# Patient Record
Sex: Female | Born: 1950 | Race: White | Hispanic: No | Marital: Married | Smoking: Current every day smoker
Health system: Southern US, Community
[De-identification: ages and names within clinical notes are randomized; demographics above are authoritative.]

## PROBLEM LIST (undated history)

## (undated) DIAGNOSIS — I10 Essential (primary) hypertension: Secondary | ICD-10-CM

## (undated) DIAGNOSIS — R0600 Dyspnea, unspecified: Secondary | ICD-10-CM

## (undated) DIAGNOSIS — E785 Hyperlipidemia, unspecified: Secondary | ICD-10-CM

## (undated) DIAGNOSIS — I471 Supraventricular tachycardia, unspecified: Secondary | ICD-10-CM

## (undated) DIAGNOSIS — K219 Gastro-esophageal reflux disease without esophagitis: Secondary | ICD-10-CM

## (undated) DIAGNOSIS — R0609 Other forms of dyspnea: Secondary | ICD-10-CM

## (undated) DIAGNOSIS — Z720 Tobacco use: Secondary | ICD-10-CM

## (undated) HISTORY — DX: Gastro-esophageal reflux disease without esophagitis: K21.9

## (undated) HISTORY — DX: Hyperlipidemia, unspecified: E78.5

## (undated) HISTORY — DX: Other forms of dyspnea: R06.09

## (undated) HISTORY — DX: Tobacco use: Z72.0

## (undated) HISTORY — DX: Supraventricular tachycardia: I47.1

## (undated) HISTORY — DX: Dyspnea, unspecified: R06.00

## (undated) HISTORY — DX: Essential (primary) hypertension: I10

## (undated) HISTORY — DX: Supraventricular tachycardia, unspecified: I47.10

---

## 2008-11-20 ENCOUNTER — Encounter: Payer: Self-pay | Admitting: Cardiovascular Disease

## 2010-04-11 ENCOUNTER — Encounter: Payer: Self-pay | Admitting: Cardiovascular Disease

## 2010-05-02 ENCOUNTER — Ambulatory Visit: Payer: Self-pay | Admitting: Cardiovascular Disease

## 2010-05-22 ENCOUNTER — Ambulatory Visit: Payer: Self-pay | Admitting: Cardiology

## 2010-05-22 ENCOUNTER — Ambulatory Visit: Payer: Self-pay

## 2010-05-22 ENCOUNTER — Ambulatory Visit (HOSPITAL_COMMUNITY): Admission: RE | Admit: 2010-05-22 | Discharge: 2010-05-22 | Payer: Self-pay | Admitting: Cardiovascular Disease

## 2010-05-26 ENCOUNTER — Ambulatory Visit: Payer: Self-pay | Admitting: Cardiovascular Disease

## 2010-11-27 ENCOUNTER — Encounter: Payer: Self-pay | Admitting: Cardiovascular Disease

## 2010-12-11 ENCOUNTER — Ambulatory Visit (INDEPENDENT_AMBULATORY_CARE_PROVIDER_SITE_OTHER): Payer: BC Managed Care – PPO | Admitting: Cardiovascular Disease

## 2010-12-11 ENCOUNTER — Encounter: Payer: Self-pay | Admitting: Cardiovascular Disease

## 2010-12-11 DIAGNOSIS — I498 Other specified cardiac arrhythmias: Secondary | ICD-10-CM

## 2010-12-11 DIAGNOSIS — R079 Chest pain, unspecified: Secondary | ICD-10-CM

## 2010-12-11 DIAGNOSIS — I471 Supraventricular tachycardia: Secondary | ICD-10-CM | POA: Insufficient documentation

## 2010-12-11 DIAGNOSIS — I10 Essential (primary) hypertension: Secondary | ICD-10-CM

## 2010-12-11 DIAGNOSIS — Z720 Tobacco use: Secondary | ICD-10-CM

## 2010-12-11 DIAGNOSIS — F17200 Nicotine dependence, unspecified, uncomplicated: Secondary | ICD-10-CM

## 2010-12-11 NOTE — Assessment & Plan Note (Signed)
This has improved significantly. Stress test was negative last year. Symptoms are atypical overall.

## 2010-12-11 NOTE — Progress Notes (Signed)
HPI  Ms. Karim is here for a 6 months follow up visit. She has been doing well overall. No episodes of tachycardia or any other arrhythmia. No palpitations, syncope or presyncope. Her chest pain has improved significantly. She still complains of dyspnea but she continues to smoke.   Allergies  Allergen Reactions  . Codeine   . Nsaids      Current Outpatient Prescriptions on File Prior to Visit  Medication Sig Dispense Refill  . diltiazem (CARDIZEM CD) 240 MG 24 hr capsule Take 240 mg by mouth daily.        . famotidine (PEPCID AC) 10 MG chewable tablet Chew 10 mg by mouth daily.        Marland Kitchen lisinopril (PRINIVIL,ZESTRIL) 10 MG tablet Take 10 mg by mouth daily.        Marland Kitchen DISCONTD: terbinafine (LAMISIL) 250 MG tablet Take 250 mg by mouth daily.          Past Medical History  Diagnosis Date  . Hyperlipidemia   . GERD (gastroesophageal reflux disease)   . Tobacco user   . Chest pain     recurrent  . Dyspnea on exertion   . 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 Hypertension      History reviewed. No pertinent past surgical history.   Family History  Problem Relation Age of Onset  . Cancer Father 30     History   Social History  . Marital Status: Married    Spouse Name: N/A    Number of Children: N/A  . Years of Education: N/A   Occupational History  . Not on file.   Social History Main Topics  . Smoking status: Current Everyday Smoker -- 0.5 packs/day for 45 years    Types: Cigarettes  . Smokeless tobacco: Not on file  . Alcohol Use: No  . Drug Use: No  . Sexually Active:    Other Topics Concern  . Not on file   Social History Narrative  . No narrative on file     ROS Constitutional: Negative for fever, chills, diaphoresis, activity change, appetite change and fatigue.  HENT: Negative for hearing loss, nosebleeds, congestion, sore throat, facial swelling, drooling, trouble swallowing, neck pain, voice  change, sinus pressure and tinnitus.  Eyes: Negative for photophobia, pain, discharge and visual disturbance.  Respiratory: Negative for apnea, cough, chest tightness and wheezing. Positive for shortness of breath Cardiovascular: Negative for chest pain, palpitations and leg swelling.  Gastrointestinal: Negative for nausea, vomiting, abdominal pain, diarrhea, constipation, blood in stool and abdominal distention.  Genitourinary: Negative for dysuria, urgency, frequency, hematuria and decreased urine volume.  Musculoskeletal: Negative for myalgias, back pain, joint swelling, arthralgias and gait problem.  Skin: Negative for color change, pallor, rash and wound.  Neurological: Negative for dizziness, tremors, seizures, syncope, speech difficulty, weakness, light-headedness, numbness and headaches.  Psychiatric/Behavioral: Negative for suicidal ideas, hallucinations, behavioral problems and agitation. The patient is not nervous/anxious.     PHYSICAL EXAM   BP 139/75  Pulse 88  Wt 180 lb 12.8 oz (82.01 kg)  SpO2 97%  Constitutional: He is oriented to person, place, and time. He appears well-developed and well-nourished. No distress.  HENT:  Head: Normocephalic and atraumatic.  Eyes: Pupils are equal, round, and reactive to light. Right eye exhibits no discharge. Left eye exhibits no discharge.  Neck: Normal range of motion. Neck supple. No JVD present. No thyromegaly present.  Cardiovascular: Normal rate, regular rhythm, normal  heart sounds and intact distal pulses. Exam reveals no gallop and no friction rub.  No murmur heard.  Pulmonary/Chest: Effort normal and breath sounds normal. No stridor. No respiratory distress. He has no wheezes. He has no rales. He exhibits no tenderness.  Abdominal: Soft. Bowel sounds are normal. He exhibits no distension. There is no tenderness. There is no rebound and no guarding.  Musculoskeletal: Normal range of motion. He exhibits no edema and no tenderness.   Neurological: He is alert and oriented to person, place, and time. Coordination normal.  Skin: Skin is warm and dry. No rash noted. He is not diaphoretic. No erythema. No pallor.  Psychiatric: He has a normal mood and affect. His behavior is normal. Judgment and thought content normal.        ASSESSMENT AND PLAN

## 2010-12-11 NOTE — Assessment & Plan Note (Signed)
No new episodes. Continue Diltiazem.

## 2010-12-11 NOTE — Assessment & Plan Note (Signed)
I advised her to quit smoking. She doesn't seem to be ready. She is trying to quit gradually on her own.

## 2010-12-11 NOTE — Assessment & Plan Note (Signed)
BP is reasonably controlled with current medications which will be continued.

## 2011-01-27 NOTE — Letter (Signed)
May 02, 2010    Blane Ohara, MD.   RE:  Laura Morris, Laura Morris  MRN:  [id number]  /  DOB:  1950/12/11   RE:  Laura Morris   Dear Dr. Sedalia Muta:   Thank you for referring Laura Morris for cardiac evaluation.  As you are  aware, this is a pleasant 60 year old female with the following problem  list:  1. Hypertension.  2. 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  3. Tobacco use.   CLINICAL HISTORY:  The patient is here today for evaluation of chest  pain.  This started in March 2010.  At that time, she had chest  tightness and went to [facility]  She ruled out for  myocardial infarction.  She underwent a nuclear stress test at that time  which was negative for ischemia.  She also had episodes of palpitations  and tachycardia that required one emergency room visit.  During that  occasion, her heart rate was above 200 according to the patient and was  given a medications with IV most likely adenosine.  Since then, she was  placed on Cardizem and has not had any further episodes.  Regarding her  chest pain, she describes it as tightness which is substernally with no  radiation.  It happens usually at rest and can wake her up from sleep.  It does not happen with activities.  The discomfort does not radiate to  the arm or the neck.  There are no other associations.  Overall, the  discomfort is mild.  There does not seem to be any aggravating or  relieving factors.  Sometimes it is associated to anxiety especially at  work.  She does have chronic dyspnea, but there has been no change in  this.  There is no orthopnea or PND.  There is no presyncope or syncope.   PAST MEDICAL HISTORY:  As mentioned above.   MEDICATIONS:  1. Lisinopril 10 mg daily.  2. Terbinafine 250 mg once daily.  3. Diltiazem XR 240 mg once daily.  4. Pepcid AC once daily.   ALLERGIES:  1. CODEINE.  2. ANTI-INFLAMMATORY DRUGS INCLUDING NONSTEROIDAL ANTI-INFLAMMATORY       DRUGS.   SOCIAL HISTORY:  Remarkable for smoking half pack per day since she was  43 years.  She denies any alcohol or recreational drug use.  There is no  regular exercise program.  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   FAMILY HISTORY:  Remarkable for coronary artery disease.  Her mother had  coronary artery disease in her 69s as well as her brother.   REVIEW OF SYSTEMS:  Remarkable for chest pain, dyspnea, heartburn and  constipation.  A full review of system was performed and is otherwise  negative.   PHYSICAL EXAMINATION:  GENERAL:  The patient is pleasant and in no acute  distress.  VITAL SIGNS:  Weight is 185 pounds, blood pressure is 165/75, pulse is  67, oxygen saturation is 97% on room air.  HEENT:  Normocephalic, atraumatic.  NECK:  Examination reveals no JVD or carotid bruits.  RESPIRATORY:  Normal respiratory effort with no use of accessory  muscles.  Auscultation reveals normal breath sounds with no crackles or  wheezing.  CARDIOVASCULAR:  Normal PMI.  Regular rate and rhythm with no gallops or  murmurs.  ABDOMEN:  Benign, nontender, nondistended.  EXTREMITIES:  With no clubbing, cyanosis or edema.  SKIN:  Examination is warm and dry with no rash.  PSYCHIATRIC:  She is alert, oriented x3 with normal mood and affect.  MUSCULOSKELETAL:  There is normal muscle strength in the upper and lower  extremities.   DIAGNOSTICS:  The electrocardiogram was done which showed normal sinus  rhythm with poor R-wave progression in the anterior leads and left axis  deviation.   LABORATORY DATA:  Her labs were reviewed and overall were unremarkable.   IMPRESSION:  1. Atypical chest pain with exertional dyspnea:  She did have a stress      test done about 1-1/2 years ago in March 2010 which was negative      for ischemia.  However since then, she had more recurrent episodes      and seems to be happening more frequently recently.  Her risk      factors include  hypertension, tobacco use and family history of      heart disease.  I would like to evaluate this by stress      echocardiogram.  This will give [place] an idea also about the structure      of her heart and make sure there are no structural abnormalities to      account for her symptoms.  If the stress test is negative, other      possibility is small vessel disease or endothelial dysfunction      which can happen more frequently in females.  In that situation, we      will attempt to treat with a small dose aspirin and long-acting      nitroglycerin.  The patient does have gastroesophageal reflux      disease, but her symptoms are reasonably controlled with Pepcid.      Chest tightness does not seem to be related.  2. Palpitations with history of supraventricular tachycardia:  The      patient has not had any recent episodes of tachycardia.  Her      symptoms have been controlled with diltiazem which will be      continued.  If she gets any recurrent episodes in the future, then      an antiarrhythmic medication or an ablation procedure can be      considered.  3. Hypertension:  Her blood pressure is elevated, but she did not take      her medications today.  We will continue to monitor.  4. Tobacco use:  The patient was counseled regarding the importance of      smoking cessation.   Thank you for allowing me to participate in the care of your patient.    Sincerely,      Lorine Bears, MD  Electronically Signed    MA/MedQ  DD: 05/02/2010  DT: 05/02/2010  Job #: [id number]

## 2011-01-27 NOTE — Assessment & Plan Note (Signed)
[facility]                        [facility] CARDIOLOGY OFFICE NOTE   SONNIE, BIAS                       MRN:          [id number]  DATE:05/26/2010                            DOB:          Oct 01, 1950    PROBLEMS LIST:  1. Hypertension.  2. Supraventricular tachycardia due to atrioventricular nodal reentry      tachycardia.  3. Tobacco use.  4. Recurrent chest pain.   INTERVAL HISTORY:  I saw Ms. Chalk last month for evaluation of chest  pain.  She continues to have mild chest tightness which most of the time  happens at rest and sometimes when she is stressed or anxious.  She gets  out of breath with activities.  She continues to smoke between half a  pack to pack per day for many years.  She has not had any recurrent  episodes of tachycardia.   MEDICATIONS:  1. Lisinopril 10 mg daily.  2. Diltiazem XR 240 mg once daily.  3. Pepcid AC once daily.   PHYSICAL EXAMINATION:  VITAL SIGNS:  Weight is 183.2 pounds, blood  pressure is 157/81, pulse is 68, oxygen saturation is 97% on room air.  NECK:  Reveals no JVD or carotid bruits.  LUNGS:  Clear to auscultation.  HEART:  Regular rate and rhythm with no gallops or murmurs.  ABDOMEN:  Benign, nontender, nondistended.  EXTREMITIES:  No clubbing, cyanosis, or edema.   IMPRESSION:  1. Atypical chest pain with exertional dyspnea:  We did stress      echocardiogram.  She was able to exercise 2 stages on Bruce      protocol.  She achieved maximum heart rate of 133 beats per minute      which is 83% of maximum predicted heart rate.  There was no      significant EKG changes.  The echocardiogram showed normal LV      systolic function with normal wall motion at rest and with      exercise.  Basically, there is no evidence of ischemia on the      current test at a slightly submaximal workload of 83% of maximal      predicted heart rate.  Her chest pain overall does not seem to be      cardiac in  nature.  It seems to be mild and it is not interfering      with her activities of daily living.  Thus, we will just monitor      this for now.  Her gastroesophageal reflux disease symptoms seems      to be controlled.  I did discuss with her the importance of      lifestyle changes.  It is very important for her to engage in a      regular exercise program which was discussed with her as well as      healthy diet and definitely trying to quit smoking.  2. Hypertension:  Her blood pressure is elevated today.  If this      continues to be an issue, then consider increasing the dose of  lisinopril.  3. History of supraventricular tachycardia:  Her symptoms have been      controlled with diltiazem and has not had any recurrent episodes      since then.  4. Tobacco use:  The patient was counseled regarding smoking      cessation.  The patient will follow up with [place] as needed if there      is any change in her chest pain or if she gets any recurrent      symptoms of supraventricular tachycardia.     Lorine Bears, MD  Electronically Signed    MA/MedQ  DD: 05/26/2010  DT: 05/27/2010  Job #: [id number]

## 2011-07-22 ENCOUNTER — Encounter: Payer: Self-pay | Admitting: Cardiovascular Disease

## 2011-09-24 ENCOUNTER — Encounter: Payer: Self-pay | Admitting: Cardiology

## 2011-09-24 ENCOUNTER — Ambulatory Visit (INDEPENDENT_AMBULATORY_CARE_PROVIDER_SITE_OTHER): Payer: BC Managed Care – PPO | Admitting: Cardiology

## 2011-09-24 DIAGNOSIS — I471 Supraventricular tachycardia: Secondary | ICD-10-CM

## 2011-09-24 DIAGNOSIS — R079 Chest pain, unspecified: Secondary | ICD-10-CM

## 2011-09-24 DIAGNOSIS — Z720 Tobacco use: Secondary | ICD-10-CM

## 2011-09-24 DIAGNOSIS — F17200 Nicotine dependence, unspecified, uncomplicated: Secondary | ICD-10-CM

## 2011-09-24 DIAGNOSIS — I10 Essential (primary) hypertension: Secondary | ICD-10-CM

## 2011-09-24 DIAGNOSIS — I498 Other specified cardiac arrhythmias: Secondary | ICD-10-CM

## 2011-09-24 NOTE — Assessment & Plan Note (Signed)
Patient continues to have occasional atypical chest pain. Previous functional study is negative. I offered a repeat study today but she declined. Continue to follow.

## 2011-09-24 NOTE — Assessment & Plan Note (Signed)
Patient counseled on discontinuing. 

## 2011-09-24 NOTE — Patient Instructions (Signed)
Your physician wants you to follow-up in: ONE YEAR You will receive a reminder letter in the mail two months in advance. If you don't receive a letter, please call our office to schedule the follow-up appointment.  

## 2011-09-24 NOTE — Assessment & Plan Note (Signed)
Blood pressure controlled. Continue present medications. 

## 2011-09-24 NOTE — Progress Notes (Signed)
   HPI: 61 year old female previously followed by Dr. Kirke Corin for followup of SVT. Patient apparently had a negative Myoview in March of 2010 and [place]. She also apparently had a bout of SVT and received adenosine at that time. Stress echocardiogram in September of 2011 was normal. It should be noted the patient did not achieve target heart rate. She was placed on Cardizem and had no further episodes of SVT. Patient last seen in March of 2012. Since then, she denies dyspnea on exertion, orthopnea, PND, pedal edema, palpitations or syncope. She continues to have occasional chest pain which has been a long-standing issue for her. It is in the left chest area without radiation. No associated symptoms. It lasts several minutes and resolves spontaneously. It is not exertional, pleuritic. It can improve with turning over in bed.  Current Outpatient Prescriptions  Medication Sig Dispense Refill  . diltiazem (CARDIZEM CD) 240 MG 24 hr capsule Take 240 mg by mouth daily.        . famotidine (PEPCID AC) 10 MG chewable tablet Chew 10 mg by mouth daily.        Marland Kitchen lisinopril (PRINIVIL,ZESTRIL) 10 MG tablet Take 10 mg by mouth daily.        . Magnesium (MAGNACAPS PO) Take 1 tablet by mouth daily.      Marland Kitchen VITAMIN D, ERGOCALCIFEROL, PO Take 1 tablet by mouth daily.         Past Medical History  Diagnosis Date  . Hyperlipidemia   . GERD (gastroesophageal reflux disease)   . Tobacco user   . Chest pain     recurrent  . Dyspnea on exertion   . 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 Hypertension     No past surgical history on file.  History   Social History  . Marital Status: Married    Spouse Name: N/A    Number of Children: N/A  . Years of Education: N/A   Occupational History  . Not on file.   Social History Main Topics  . Smoking status: Current Everyday Smoker -- 0.5 packs/day for 45 years    Types: Cigarettes  . Smokeless tobacco: Not on  file  . Alcohol Use: No  . Drug Use: No  . Sexually Active:    Other Topics Concern  . Not on file   Social History Narrative  . No narrative on file    ROS: no fevers or chills, productive cough, hemoptysis, dysphasia, odynophagia, melena, hematochezia, dysuria, hematuria, rash, seizure activity, orthopnea, PND, pedal edema, claudication. Remaining systems are negative.  Physical Exam: Well-developed well-nourished in no acute distress.  Skin is warm and dry.  HEENT is normal.  Neck is supple. No thyromegaly.  Chest is clear to auscultation with normal expansion.  Cardiovascular exam is regular rate and rhythm.  Abdominal exam nontender or distended. No masses palpated. Extremities show no edema. neuro grossly intact  ECG normal sinus rhythm at a rate of 67. Left axis deviation. Nonspecific ST changes.

## 2011-09-24 NOTE — Assessment & Plan Note (Signed)
Patient has had no recurrent events. Continue Cardizem. If she has more frequent events in the future could consider ablation.

## 2011-12-07 ENCOUNTER — Telehealth: Payer: Self-pay | Admitting: Cardiology

## 2011-12-07 NOTE — Telephone Encounter (Signed)
Spoke with pt, she cont to have an "uncomfortable feeling" in her back between her shoulder blades and occ in her chest. She reports it being there all the time and it hurts with movement. She denies SOB. She has been taking aspirin with relief. She is interested in a stress test but she will have to check with insurance about how much she would have to pay. Will find out what type of stress testing dr Jens Som wants and will make pt aware.

## 2011-12-07 NOTE — Telephone Encounter (Signed)
New Msg: Pt calling wanting to speak with nurse/MD regarding recent episode pt had on Friday evening in which pt awoke c/o chest tightness. Pt took aspiring and relocated from the bed to the recliner. Pt had relief following aspirin. Saturday pt c/o pain in back and chest tightness, and fatigue. On Sunday pt c/o fatigue and hot flashes, a cold chills from the inside.   Pt wants to know if nurse/MD advise that pt come in to see MD. Please return pt call to discuss further.

## 2011-12-07 NOTE — Telephone Encounter (Signed)
Stress myoview Olga Millers

## 2011-12-07 NOTE — Telephone Encounter (Signed)
Spoke with pt, she is aware of the testing dr Jens Som would want her to have. She reports her deductible is too high and she will not be able to afford to have the testing done at this time. She states she will go to the ER if symptoms worsen or change.

## 2012-07-05 ENCOUNTER — Other Ambulatory Visit: Payer: Self-pay | Admitting: Internal Medicine

## 2012-07-05 DIAGNOSIS — Z1231 Encounter for screening mammogram for malignant neoplasm of breast: Secondary | ICD-10-CM

## 2012-07-12 ENCOUNTER — Other Ambulatory Visit: Payer: Self-pay | Admitting: Internal Medicine

## 2012-07-12 DIAGNOSIS — R1032 Left lower quadrant pain: Secondary | ICD-10-CM

## 2012-07-15 ENCOUNTER — Ambulatory Visit
Admission: RE | Admit: 2012-07-15 | Discharge: 2012-07-15 | Disposition: A | Discharge status: Home / Self Care | Payer: BC Managed Care – PPO | Source: Ambulatory Visit | Attending: Internal Medicine | Admitting: Internal Medicine

## 2012-07-15 DIAGNOSIS — R1032 Left lower quadrant pain: Secondary | ICD-10-CM

## 2012-08-12 ENCOUNTER — Ambulatory Visit: Payer: BC Managed Care – PPO

## 2012-08-18 ENCOUNTER — Ambulatory Visit: Payer: BC Managed Care – PPO

## 2012-08-19 ENCOUNTER — Ambulatory Visit
Admission: RE | Admit: 2012-08-19 | Discharge: 2012-08-19 | Disposition: A | Discharge status: Home / Self Care | Payer: BC Managed Care – PPO | Source: Ambulatory Visit | Attending: Internal Medicine | Admitting: Internal Medicine

## 2012-08-19 DIAGNOSIS — Z1231 Encounter for screening mammogram for malignant neoplasm of breast: Secondary | ICD-10-CM

## 2013-04-14 ENCOUNTER — Ambulatory Visit (INDEPENDENT_AMBULATORY_CARE_PROVIDER_SITE_OTHER): Payer: BC Managed Care – PPO | Admitting: Cardiology

## 2013-04-14 ENCOUNTER — Encounter: Payer: Self-pay | Admitting: Cardiology

## 2013-04-14 VITALS — BP 142/76 | HR 68 | Ht 66.0 in | Wt 179.0 lb

## 2013-04-14 DIAGNOSIS — F17200 Nicotine dependence, unspecified, uncomplicated: Secondary | ICD-10-CM

## 2013-04-14 DIAGNOSIS — Z720 Tobacco use: Secondary | ICD-10-CM

## 2013-04-14 DIAGNOSIS — I10 Essential (primary) hypertension: Secondary | ICD-10-CM

## 2013-04-14 DIAGNOSIS — I471 Supraventricular tachycardia: Secondary | ICD-10-CM

## 2013-04-14 DIAGNOSIS — R079 Chest pain, unspecified: Secondary | ICD-10-CM

## 2013-04-14 DIAGNOSIS — I498 Other specified cardiac arrhythmias: Secondary | ICD-10-CM

## 2013-04-14 NOTE — Patient Instructions (Addendum)
Your physician wants you to follow-up in: ONE YEAR WITH DR CRENSHAW You will receive a reminder letter in the mail two months in advance. If you don't receive a letter, please call our office to schedule the follow-up appointment.  

## 2013-04-14 NOTE — Assessment & Plan Note (Signed)
Continue Cardizem. If she has more frequent episodes in the future could consider referral for ablation.

## 2013-04-14 NOTE — Assessment & Plan Note (Signed)
Continue present medications. 

## 2013-04-14 NOTE — Progress Notes (Signed)
   HPI: Pleasant female for followup of SVT. Patient apparently had a negative Myoview in March of 2010 in [place]. She also apparently had a bout of SVT and received adenosine at that time. Stress echocardiogram in September of 2011 was normal. It should be noted the patient did not achieve target heart rate. She was placed on Cardizem and had no further episodes of SVT. Patient last seen in Jan 2013. Since then, the patient denies any dyspnea on exertion, orthopnea, PND, pedal edema, palpitations, syncope. Occasional chest discomfort with lying flat which is chronic. No exertional chest pain.    Current Outpatient Prescriptions  Medication Sig Dispense Refill  . diltiazem (CARDIZEM CD) 240 MG 24 hr capsule Take 240 mg by mouth daily.        . famotidine (PEPCID AC) 10 MG chewable tablet Chew 10 mg by mouth daily.        Marland Kitchen lisinopril (PRINIVIL,ZESTRIL) 10 MG tablet Take 10 mg by mouth daily.        . Magnesium (MAGNACAPS PO) Take 1 tablet by mouth daily.      Marland Kitchen VITAMIN D, ERGOCALCIFEROL, PO Take 1 tablet by mouth daily.       No current facility-administered medications for this visit.     Past Medical History  Diagnosis Date  . Hyperlipidemia   . GERD (gastroesophageal reflux disease)   . Tobacco user   . Chest pain     recurrent  . Dyspnea on exertion   . 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 Hypertension     History reviewed. No pertinent past surgical history.  History   Social History  . Marital Status: Married    Spouse Name: N/A    Number of Children: N/A  . Years of Education: N/A   Occupational History  . Not on file.   Social History Main Topics  . Smoking status: Current Every Day Smoker -- 0.50 packs/day for 45 years    Types: Cigarettes  . Smokeless tobacco: Not on file  . Alcohol Use: No  . Drug Use: No  . Sexually Active:    Other Topics Concern  . Not on file   Social History Narrative  . No  narrative on file    ROS: no fevers or chills, productive cough, hemoptysis, dysphasia, odynophagia, melena, hematochezia, dysuria, hematuria, rash, seizure activity, orthopnea, PND, pedal edema, claudication. Remaining systems are negative.  Physical Exam: Well-developed well-nourished in no acute distress.  Skin is warm and dry.  HEENT is normal.  Neck is supple.  Chest is clear to auscultation with normal expansion.  Cardiovascular exam is regular rate and rhythm.  Abdominal exam nontender or distended. No masses palpated. Extremities show no edema. neuro grossly intact  ECG sinus rhythm at a rate of 68. No significant ST changes.

## 2013-04-14 NOTE — Assessment & Plan Note (Signed)
Symptoms atypical and previous functional study negative. No plans for further evaluation at this point. Symptoms are chronic.

## 2013-04-14 NOTE — Assessment & Plan Note (Signed)
Patient counseled on discontinuing. 

## 2013-07-17 ENCOUNTER — Other Ambulatory Visit: Payer: Self-pay

## 2013-07-17 DIAGNOSIS — Z1231 Encounter for screening mammogram for malignant neoplasm of breast: Secondary | ICD-10-CM

## 2013-08-21 ENCOUNTER — Ambulatory Visit: Payer: BC Managed Care – PPO

## 2013-08-25 ENCOUNTER — Ambulatory Visit: Payer: BC Managed Care – PPO

## 2013-10-23 ENCOUNTER — Ambulatory Visit
Admission: RE | Admit: 2013-10-23 | Discharge: 2013-10-23 | Disposition: A | Discharge status: Home / Self Care | Payer: Medicare Other | Source: Ambulatory Visit

## 2013-10-23 DIAGNOSIS — Z1231 Encounter for screening mammogram for malignant neoplasm of breast: Secondary | ICD-10-CM

## 2013-12-18 ENCOUNTER — Observation Stay (HOSPITAL_COMMUNITY)
Admission: EM | Admit: 2013-12-18 | Discharge: 2013-12-19 | Disposition: A | Discharge status: Home / Self Care | Payer: Medicare Other | Attending: Internal Medicine | Admitting: Internal Medicine

## 2013-12-18 ENCOUNTER — Emergency Department (HOSPITAL_COMMUNITY): Payer: Medicare Other

## 2013-12-18 ENCOUNTER — Telehealth: Payer: Self-pay | Admitting: Physician Assistant

## 2013-12-18 DIAGNOSIS — Z720 Tobacco use: Secondary | ICD-10-CM | POA: Diagnosis present

## 2013-12-18 DIAGNOSIS — Z79899 Other long term (current) drug therapy: Secondary | ICD-10-CM | POA: Insufficient documentation

## 2013-12-18 DIAGNOSIS — R079 Chest pain, unspecified: Secondary | ICD-10-CM | POA: Diagnosis present

## 2013-12-18 DIAGNOSIS — E785 Hyperlipidemia, unspecified: Secondary | ICD-10-CM | POA: Insufficient documentation

## 2013-12-18 DIAGNOSIS — F17200 Nicotine dependence, unspecified, uncomplicated: Secondary | ICD-10-CM | POA: Insufficient documentation

## 2013-12-18 DIAGNOSIS — I10 Essential (primary) hypertension: Secondary | ICD-10-CM | POA: Insufficient documentation

## 2013-12-18 DIAGNOSIS — I200 Unstable angina: Principal | ICD-10-CM | POA: Insufficient documentation

## 2013-12-18 DIAGNOSIS — K219 Gastro-esophageal reflux disease without esophagitis: Secondary | ICD-10-CM | POA: Insufficient documentation

## 2013-12-18 DIAGNOSIS — R42 Dizziness and giddiness: Secondary | ICD-10-CM | POA: Insufficient documentation

## 2013-12-18 DIAGNOSIS — I498 Other specified cardiac arrhythmias: Secondary | ICD-10-CM | POA: Insufficient documentation

## 2013-12-18 DIAGNOSIS — I471 Supraventricular tachycardia: Secondary | ICD-10-CM | POA: Diagnosis present

## 2013-12-18 DIAGNOSIS — L301 Dyshidrosis [pompholyx]: Secondary | ICD-10-CM | POA: Insufficient documentation

## 2013-12-18 DIAGNOSIS — R0602 Shortness of breath: Secondary | ICD-10-CM | POA: Insufficient documentation

## 2013-12-18 DIAGNOSIS — R110 Nausea: Secondary | ICD-10-CM | POA: Insufficient documentation

## 2013-12-18 LAB — CBC
HCT: 40.6 % (ref 36.0–46.0)
Hemoglobin: 14 g/dL (ref 12.0–15.0)
MCH: 31.1 pg (ref 26.0–34.0)
MCHC: 34.5 g/dL (ref 30.0–36.0)
MCV: 90.2 fL (ref 78.0–100.0)
PLATELETS: 239 10*3/uL (ref 150–400)
RBC: 4.5 MIL/uL (ref 3.87–5.11)
RDW: 12.5 % (ref 11.5–15.5)
WBC: 8.7 10*3/uL (ref 4.0–10.5)

## 2013-12-18 LAB — BASIC METABOLIC PANEL
BUN: 14 mg/dL (ref 6–23)
CALCIUM: 9.2 mg/dL (ref 8.4–10.5)
CO2: 23 meq/L (ref 19–32)
Chloride: 103 mEq/L (ref 96–112)
Creatinine, Ser: 0.75 mg/dL (ref 0.50–1.10)
GFR calc Af Amer: >90 mL/min (ref >90)
GFR, EST NON AFRICAN AMERICAN: 87 mL/min — AB (ref >90)
Glucose, Bld: 91 mg/dL (ref 70–99)
Potassium: 4.5 mEq/L (ref 3.7–5.3)
SODIUM: 143 meq/L (ref 137–147)

## 2013-12-18 LAB — I-STAT TROPONIN, ED: TROPONIN I, POC: 0 ng/mL (ref 0.00–0.08)

## 2013-12-18 LAB — PRO B NATRIURETIC PEPTIDE: Pro B Natriuretic peptide (BNP): 82.4 pg/mL (ref 0–125)

## 2013-12-18 MED ORDER — ASPIRIN EC 81 MG PO TBEC
81.0000 mg | DELAYED_RELEASE_TABLET | Freq: Every day | ORAL | Status: DC
  Administered 2013-12-19: 81 mg via ORAL
  Filled 2013-12-18: qty 1

## 2013-12-18 MED ORDER — NITROGLYCERIN 0.4 MG SL SUBL: 0.4000 mg | SUBLINGUAL_TABLET | SUBLINGUAL | Status: DC | PRN

## 2013-12-18 MED ORDER — DILTIAZEM HCL ER COATED BEADS 240 MG PO CP24
240.0000 mg | ORAL_CAPSULE | Freq: Every day | ORAL | Status: DC
  Administered 2013-12-19: 240 mg via ORAL
  Filled 2013-12-18: qty 1

## 2013-12-18 MED ORDER — ASPIRIN 81 MG PO CHEW
324.0000 mg | CHEWABLE_TABLET | Freq: Once | ORAL | Status: AC
  Administered 2013-12-18: 243 mg via ORAL
  Filled 2013-12-18: qty 4

## 2013-12-18 MED ORDER — ENOXAPARIN SODIUM 40 MG/0.4ML ~~LOC~~ SOLN
40.0000 mg | SUBCUTANEOUS | Status: DC
  Filled 2013-12-18: qty 0.4

## 2013-12-18 MED ORDER — SIMVASTATIN 40 MG PO TABS: 40.0000 mg | ORAL_TABLET | Freq: Every day | ORAL | Status: DC

## 2013-12-18 MED ORDER — METOPROLOL TARTRATE 25 MG PO TABS
25.0000 mg | ORAL_TABLET | Freq: Two times a day (BID) | ORAL | Status: DC
  Administered 2013-12-19: 25 mg via ORAL
  Filled 2013-12-18 (×3): qty 1

## 2013-12-18 MED ORDER — LISINOPRIL 10 MG PO TABS
10.0000 mg | ORAL_TABLET | Freq: Every day | ORAL | Status: DC
  Administered 2013-12-19: 10 mg via ORAL
  Filled 2013-12-18: qty 1

## 2013-12-18 NOTE — H&P (Addendum)
History and Physical  Patient ID: Laura Morris MRN: [id number], DOB: Dec 03, 1950 Date of Encounter: 12/18/2013, 11:00 PM Primary Physician: Thressa Sheller, MD Primary Cardiologist: Alecia Lemming   Chief Complaint: chest pain  Reason for Admission:  Chest pain   HPI: 63 yr old female with hx of HTN , HLD , smoker , FH of CAD, SVT,   admitted with chest pain   Pt  States that earlier today she had sudden onset of substernal chest pain " like something hit me and then is sitting on me " with associated SOB , nausea and lightheadedness. Pt resolved spontaneously over the next several minutes prior to her arrival in the ED .  Pt denies anyorthopnea, PND , LE edema , Syncope ,claudcation , focal weakness, or bleeding diathesis .  PSH   Works with customer service  Smokes  Half pack per day X 66 yrs   FH  Mother had MI in her early 37's     Past Medical History  Diagnosis Date  . Hyperlipidemia   . GERD (gastroesophageal reflux disease)   . Tobacco user   . Chest pain     recurrent  . Dyspnea on exertion   . 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 Hypertension      Most Recent Cardiac Studies:    Surgical History: No past surgical history on file.   Home Meds: Prior to Admission medications   Medication Sig Start Date End Date Taking? Authorizing Provider  diltiazem (CARDIZEM CD) 240 MG 24 hr capsule Take 240 mg by mouth daily.     Yes Historical Provider, MD  lisinopril (PRINIVIL,ZESTRIL) 10 MG tablet Take 10 mg by mouth daily.     Yes Historical Provider, MD  Magnesium (MAGNACAPS PO) Take 1 tablet by mouth daily.   Yes Historical Provider, MD  omeprazole (PRILOSEC) 20 MG capsule Take 20 mg by mouth daily.   Yes Historical Provider, MD  VITAMIN D, ERGOCALCIFEROL, PO Take 1 tablet by mouth daily.   Yes Historical Provider, MD    Allergies:  Allergies  Allergen Reactions  . Codeine Other (See Comments)    Stomach cramps    . Nsaids Nausea And Vomiting    History   Social History  . Marital Status: Married    Spouse Name: N/A    Number of Children: N/A  . Years of Education: N/A   Occupational History  . Not on file.   Social History Main Topics  . Smoking status: Current Every Day Smoker -- 0.50 packs/day for 45 years    Types: Cigarettes  . Smokeless tobacco: Not on file  . Alcohol Use: No  . Drug Use: No  . Sexual Activity:    Other Topics Concern  . Not on file   Social History Narrative  . No narrative on file     Family History  Problem Relation Age of Onset  . Cancer Father 5    Review of Systems: General: negative for chills, fever, night sweats or weight changes.  Cardiovascular: per HPI  Respiratory: negative for cough or wheezing Urologic: negative for hematuria Abdominal: negative for nausea, vomiting, diarrhea, bright red blood per rectum, melena, or hematemesis Neurologic: negative for visual changes, syncope, or dizziness All other systems reviewed and are otherwise negative except as noted above.  Labs:   Lab Results  Component Value Date   WBC 8.7 12/18/2013   HGB 14.0 12/18/2013   HCT 40.6 12/18/2013  MCV 90.2 12/18/2013   PLT 239 12/18/2013    Recent Labs Lab 12/18/13 2021  NA 143  K 4.5  CL 103  CO2 23  BUN 14  CREATININE 0.75  CALCIUM 9.2  GLUCOSE 91   No results found for this basename: CKTOTAL, CKMB, TROPONINI,  in the last 72 hours No results found for this basename: CHOL, HDL, LDLCALC, TRIG   No results found for this basename: DDIMER    Radiology/Studies:  Dg Chest 2 View  12/18/2013   CLINICAL DATA:  Chest pain.  History of smoking.  EXAM: CHEST  2 VIEW  COMPARISON:  Chest radiograph performed 10/23/2009  FINDINGS: The lungs are well-aerated. Mild peribronchial thickening is noted. Chronically increased interstitial markings are seen. There is no evidence of pleural effusion or pneumothorax.  The heart is normal in size; the mediastinal contour  is within normal limits. No acute osseous abnormalities are seen. Clips are noted within the right upper quadrant, reflecting prior cholecystectomy.  IMPRESSION: Chronic lung changes noted; no acute cardiopulmonary process seen.   Electronically Signed   By: Garald Balding M.D.   On: 12/18/2013 22:36     EKG: NSR, LAD , PRWP with low voltage in chest leads   Physical Exam: Blood pressure 143/83, pulse 73, resp. rate 25, height 5\' 6"  (1.676 m), weight 84.006 kg (185 lb 3.2 oz), SpO2 99.00%. General: Well developed, well nourished, in no acute distress. Head: Normocephalic, atraumatic, sclera non-icteric, no xanthomas, nares are without discharge.  Neck: Negative for carotid bruits. JVD not elevated. Lungs: Clear bilaterally to auscultation without wheezes, rales, or rhonchi. Breathing is unlabored. Heart: RRR with S1 S2. No murmurs, rubs, or gallops appreciated. Abdomen: Soft, non-tender, non-distended with normoactive bowel sounds. No hepatomegaly. No rebound/guarding. No obvious abdominal masses. Msk:  Strength and tone appear normal for age. Extremities: No clubbing or cyanosis. No edema.  Distal pedal pulses are 2+ and equal bilaterally. Neuro: Alert and oriented X 3. No focal deficit. No facial asymmetry. Moves all extremities spontaneously. Psych:  Responds to questions appropriately with a normal affect.    ASSESSMENT AND PLAN:  Precordial Chest pain - concerning for angina  Hx of SVT  HTN  Smoker   Plan  - admit to tele  - Trend CE  - start Aspirin , statin , b-blocker,  - will anticoagulate if CE become elevated , currently symptom free  - Pt with multiple risk factors and typical anginal symptoms.  Will consider stress test vs LHC in am for further eval in am    Signed, Arnoldo Lenis, A M.D  12/18/2013, 11:00 PM

## 2013-12-18 NOTE — Telephone Encounter (Signed)
    Laura Morris has a history of SVT but no prior CAD. She called tonight to report crushing chest pain associated with diaphoresis, mild SOB, weakness and lightheadedness. It lasted ~ 5 minutes. She has never had anything like this before. I advised her to come to the emergency department and she verbalized understanding.   Perry Mount PA-C  [facility]

## 2013-12-18 NOTE — ED Notes (Signed)
Pt. States she was sitting at home and had sudden onset chest pain, states pain "felt like someone punched me in chest". Reports trying to get up and walk around and became lightheaded and dizzy. Called [facility] and was told to come here. Pt. Denies pain at this time. Reports intermittently having slight chest pressure but nothing as bad as the first episode.

## 2013-12-18 NOTE — ED Provider Notes (Addendum)
CSN: [id number]     Arrival date & time 12/18/13  1934 History   First MD Initiated Contact with Patient 12/18/13 2032     Chief Complaint  Patient presents with  . Chest Pain     (Consider location/radiation/quality/duration/timing/severity/associated sxs/prior Treatment) Patient is a 63 y.o. female presenting with chest pain. The history is provided by the patient.  Chest Pain Pain location:  Substernal area Pain quality: aching, pressure and tightness   Pain radiates to:  Does not radiate Pain radiates to the back: no   Pain severity:  Severe Onset quality:  Sudden Duration:  10 minutes Timing:  Constant Progression:  Resolved Chronicity:  New Context comment:  Started while sitting looking at the mail Relieved by:  Nothing Worsened by:  Nothing tried Ineffective treatments:  Aspirin and rest Associated symptoms: diaphoresis, dizziness, nausea and shortness of breath   Associated symptoms: no abdominal pain, no lower extremity edema, no palpitations and not vomiting   Risk factors: high cholesterol, hypertension and smoking     Past Medical History  Diagnosis Date  . Hyperlipidemia   . GERD (gastroesophageal reflux disease)   . Tobacco user   . Chest pain     recurrent  . Dyspnea on exertion   . 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 Hypertension    No past surgical history on file. Family History  Problem Relation Age of Onset  . Cancer Father 4   History  Substance Use Topics  . Smoking status: Current Every Day Smoker -- 0.50 packs/day for 45 years    Types: Cigarettes  . Smokeless tobacco: Not on file  . Alcohol Use: No   OB History   Grav Para Term Preterm Abortions TAB SAB Ect Mult Living                 Review of Systems  Constitutional: Positive for diaphoresis.  Respiratory: Positive for shortness of breath.   Cardiovascular: Positive for chest pain. Negative for palpitations.  Gastrointestinal:  Positive for nausea. Negative for vomiting and abdominal pain.  Neurological: Positive for dizziness.  All other systems reviewed and are negative.     Allergies  Codeine and Nsaids  Home Medications   Current Outpatient Rx  Name  Route  Sig  Dispense  Refill  . diltiazem (CARDIZEM CD) 240 MG 24 hr capsule   Oral   Take 240 mg by mouth daily.           Marland Kitchen lisinopril (PRINIVIL,ZESTRIL) 10 MG tablet   Oral   Take 10 mg by mouth daily.           . Magnesium (MAGNACAPS PO)   Oral   Take 1 tablet by mouth daily.         Marland Kitchen omeprazole (PRILOSEC) 20 MG capsule   Oral   Take 20 mg by mouth daily.         Marland Kitchen VITAMIN D, ERGOCALCIFEROL, PO   Oral   Take 1 tablet by mouth daily.          BP 188/59  Pulse 74  Resp 20  Ht 5\' 6"  (1.676 m)  Wt 185 lb 3.2 oz (84.006 kg)  BMI 29.91 kg/m2  SpO2 97% Physical Exam  Nursing note and vitals reviewed. Constitutional: She is oriented to person, place, and time. She appears well-developed and well-nourished. No distress.  HENT:  Head: Normocephalic and atraumatic.  Mouth/Throat: Oropharynx is clear and moist.  Eyes: Conjunctivae and EOM are normal. Pupils are equal, round, and reactive to light.  Neck: Normal range of motion. Neck supple.  Cardiovascular: Normal rate, regular rhythm and intact distal pulses.   No murmur heard. Pulmonary/Chest: Effort normal and breath sounds normal. No respiratory distress. She has no wheezes. She has no rales.  Abdominal: Soft. She exhibits no distension. There is no tenderness. There is no rebound and no guarding.  Musculoskeletal: Normal range of motion. She exhibits no edema and no tenderness.  Neurological: She is alert and oriented to person, place, and time.  Skin: Skin is warm and dry. No rash noted. No erythema.  Psychiatric: She has a normal mood and affect. Her behavior is normal.    ED Course  Procedures (including critical care time) Labs Review Labs Reviewed  BASIC METABOLIC  PANEL - Abnormal; Notable for the following:    GFR calc non Af Amer 87 (*)    All other components within normal limits  CBC  PRO B NATRIURETIC PEPTIDE  I-STAT TROPOININ, ED   Imaging Review Dg Chest 2 View  12/18/2013   CLINICAL DATA:  Chest pain.  History of smoking.  EXAM: CHEST  2 VIEW  COMPARISON:  Chest radiograph performed 10/23/2009  FINDINGS: The lungs are well-aerated. Mild peribronchial thickening is noted. Chronically increased interstitial markings are seen. There is no evidence of pleural effusion or pneumothorax.  The heart is normal in size; the mediastinal contour is within normal limits. No acute osseous abnormalities are seen. Clips are noted within the right upper quadrant, reflecting prior cholecystectomy.  IMPRESSION: Chronic lung changes noted; no acute cardiopulmonary process seen.   Electronically Signed   By: Garald Balding M.D.   On: 12/18/2013 22:36     EKG Interpretation   Date/Time:  Monday December 18 2013 19:47:27 EDT Ventricular Rate:  75 PR Interval:  142 QRS Duration: 84 QT Interval:  374 QTC Calculation: 417 R Axis:   -35 Text Interpretation:  Normal sinus rhythm Left axis deviation Low voltage  QRS No previous tracing Confirmed by Maryan Rued  MD, Loree Fee ([id number]) on  12/18/2013 8:50:51 PM      MDM   Final diagnoses:  Unstable angina    Patient with symptoms concerning for ACS today when she had a 10 minute episode of crushing chest pain with associated symptoms. Patient has been pain free here and has never had symptoms like this in the past. She does have a history of SVT but no other known coronary artery disease. She does have risk factors such as hypertension, hyperlipidemia and is a current tobacco user. Currently EKG and troponin are within normal limits. However given patient's symptoms and risk factors she needs further evaluation and possible catheterization.  Patient received 325 of aspirin here but no nitroglycerin and she is currently  pain-free.  9:34 PM Spoke with cardiology who will come and see the pt.   Blanchie Dessert, MD 12/18/13 2135  Blanchie Dessert, MD 12/18/13 9476  Blanchie Dessert, MD 12/27/13 5465

## 2013-12-18 NOTE — ED Notes (Signed)
Pt c/o central non radiating chest pain. Pt reports onset at 1800, pain was sudden described as pressure. Pt states denies pain at this time. Pt reports an increase in pain with inspirations. Pt also reports feeling flushed. Pt is A&Ox4, respirations equal and unlabored, skin warm and dry. Pt reports taking one 81 mg asa.

## 2013-12-18 NOTE — ED Notes (Signed)
Admitting physician at bedside

## 2013-12-19 ENCOUNTER — Observation Stay (HOSPITAL_COMMUNITY): Payer: Medicare Other

## 2013-12-19 ENCOUNTER — Other Ambulatory Visit: Payer: Self-pay

## 2013-12-19 DIAGNOSIS — E785 Hyperlipidemia, unspecified: Secondary | ICD-10-CM | POA: Diagnosis present

## 2013-12-19 DIAGNOSIS — K219 Gastro-esophageal reflux disease without esophagitis: Secondary | ICD-10-CM | POA: Diagnosis present

## 2013-12-19 DIAGNOSIS — R079 Chest pain, unspecified: Secondary | ICD-10-CM

## 2013-12-19 LAB — TROPONIN I
Troponin I: <0.3 ng/mL (ref <0.30)
Troponin I: <0.3 ng/mL (ref <0.30)
Troponin I: <0.3 ng/mL (ref <0.30)

## 2013-12-19 LAB — LIPID PANEL
Cholesterol: 162 mg/dL (ref 0–200)
HDL: 58 mg/dL (ref >39)
LDL CALC: 81 mg/dL (ref 0–99)
Total CHOL/HDL Ratio: 2.8 RATIO
Triglycerides: 114 mg/dL (ref <150)
VLDL: 23 mg/dL (ref 0–40)

## 2013-12-19 LAB — BASIC METABOLIC PANEL
BUN: 12 mg/dL (ref 6–23)
CHLORIDE: 104 meq/L (ref 96–112)
CO2: 24 meq/L (ref 19–32)
Calcium: 9 mg/dL (ref 8.4–10.5)
Creatinine, Ser: 0.7 mg/dL (ref 0.50–1.10)
GFR calc Af Amer: >90 mL/min (ref >90)
GFR calc non Af Amer: 89 mL/min — ABNORMAL LOW (ref >90)
GLUCOSE: 100 mg/dL — AB (ref 70–99)
POTASSIUM: 3.9 meq/L (ref 3.7–5.3)
Sodium: 141 mEq/L (ref 137–147)

## 2013-12-19 LAB — CBC
HEMATOCRIT: 37.3 % (ref 36.0–46.0)
HEMOGLOBIN: 12.6 g/dL (ref 12.0–15.0)
MCH: 30.7 pg (ref 26.0–34.0)
MCHC: 33.8 g/dL (ref 30.0–36.0)
MCV: 90.8 fL (ref 78.0–100.0)
Platelets: 201 10*3/uL (ref 150–400)
RBC: 4.11 MIL/uL (ref 3.87–5.11)
RDW: 12.6 % (ref 11.5–15.5)
WBC: 6.5 10*3/uL (ref 4.0–10.5)

## 2013-12-19 MED ORDER — ATORVASTATIN CALCIUM 20 MG PO TABS
20.0000 mg | ORAL_TABLET | Freq: Every day | ORAL | Status: DC
  Filled 2013-12-19: qty 1

## 2013-12-19 MED ORDER — REGADENOSON 0.4 MG/5ML IV SOLN
INTRAVENOUS | Status: AC
  Administered 2013-12-19: 10:00:00
  Filled 2013-12-19: qty 5

## 2013-12-19 MED ORDER — TECHNETIUM TC 99M SESTAMIBI GENERIC - CARDIOLITE
30.0000 | Freq: Once | INTRAVENOUS | Status: AC | PRN
  Administered 2013-12-19: 30 via INTRAVENOUS

## 2013-12-19 MED ORDER — REGADENOSON 0.4 MG/5ML IV SOLN
0.4000 mg | Freq: Once | INTRAVENOUS | Status: AC
  Administered 2013-12-19: 0.4 mg via INTRAVENOUS
  Filled 2013-12-19: qty 5

## 2013-12-19 MED ORDER — ATORVASTATIN CALCIUM 20 MG PO TABS: 20.0000 mg | ORAL_TABLET | Freq: Every day | ORAL | Status: DC

## 2013-12-19 MED ORDER — METOPROLOL TARTRATE 25 MG PO TABS: 25.0000 mg | ORAL_TABLET | Freq: Two times a day (BID) | ORAL | Status: DC

## 2013-12-19 MED ORDER — TECHNETIUM TC 99M SESTAMIBI GENERIC - CARDIOLITE
10.0000 | Freq: Once | INTRAVENOUS | Status: AC | PRN
  Administered 2013-12-19: 10 via INTRAVENOUS

## 2013-12-19 NOTE — Discharge Summary (Signed)
Discharge Summary   Patient ID: Laura Morris MRN: [id number], DOB/AGE: 14-Feb-1951 63 y.o. Admit date: 12/18/2013 D/C date:     12/19/2013  Primary Cardiologist: Dr. Stanford Breed  Principal Problem:   Chest pain Active Problems:   Supraventricular tachycardia   Hypertension   Tobacco user   Hyperlipidemia   GERD (gastroesophageal reflux disease)    Admission Dates: 12/18/13 - 12/19/13 Discharge Diagnosis: Chest pain, non cardiac  HPI: Laura Morris is a 63 y.o. female with a history of HTN, HLD, continued tobacco abuse (40years), FH of CAD, and SVT which has been quiescent on diltiazem who was admitted to [facility] on 12/18/13 with chest pain. She experienced sudden onset of substernal chest pain with associated SOB, nausea and lightheadedness.   Hospital Course: She ruled out for MI with negative serial cardiac enzymes and stable ECG. However, due to her cardiac risk factors it was decided to proceed with [facility]. This returned as a low risk study; however, it was considered poor quality with motion and breast attenuation and bowel artifact on resting images. No ischemia EF 70% with no RWMA's. Of note she was quite hypertensive on admission 188/59, which improved with lisinopril and lopressor and diltiazem.   The patient has had an uncomplicated hospital course and is recovering well. She has been seen by Dr. Gwenlyn Found today and deemed stable for discharge home. Smoking cessation was disscussed in length. Discharge medications include lisinorpril, diltiazem CD, lopressor tartrate, lipitor, ASA. She has been instructed to follow up as an outpatient to titrate her blood pressure medications.     Discharge Vitals: Blood pressure 144/64, pulse 57, temperature 98 F (36.7 C), temperature source Oral, resp. rate 20, height 5\' 6"  (1.676 m), weight 181 lb 4.8 oz (82.237 kg), SpO2 97.00%.  Labs: Lab Results  Component Value Date   WBC 6.5 12/19/2013   HGB 12.6 12/19/2013   HCT 37.3 12/19/2013   MCV  90.8 12/19/2013   PLT 201 12/19/2013     Recent Labs Lab 12/19/13 0525  NA 141  K 3.9  CL 104  CO2 24  BUN 12  CREATININE 0.70  CALCIUM 9.0  GLUCOSE 100*    Recent Labs  12/19/13 0102 12/19/13 0525 12/19/13 1330  TROPONINI <0.30 <0.30 <0.30   Lab Results  Component Value Date   CHOL 162 12/19/2013   HDL 58 12/19/2013   LDLCALC 81 12/19/2013   TRIG 114 12/19/2013     Diagnostic Studies/Procedures   Dg Chest 2 View  12/18/2013   CLINICAL DATA:  Chest pain.  History of smoking.  EXAM: CHEST  2 VIEW  COMPARISON:  Chest radiograph performed 10/23/2009  FINDINGS: The lungs are well-aerated. Mild peribronchial thickening is noted. Chronically increased interstitial markings are seen. There is no evidence of pleural effusion or pneumothorax.  The heart is normal in size; the mediastinal contour is within normal limits. No acute osseous abnormalities are seen. Clips are noted within the right upper quadrant, reflecting prior cholecystectomy.  IMPRESSION: Chronic lung changes noted; no acute cardiopulmonary process seen.   Electronically Signed   By: Garald Balding M.D.   On: 12/18/2013 22:36   Nm Myocar Multi W/spect W/wall Motion / Ef  12/19/2013   CLINICAL DATA:  Chest pain  EXAM: Lexiscan Myovue  TECHNIQUE: The patient received IV Lexiscan .4mg  over 15 seconds. 33.0 mCi of Technetium 75m Sestamibi injected at 30 seconds. Quantitative SPECT images were obtained in the vertical, horizontal and short axis planes after a 45 minute delay.  Rest images were obtained with similar planes and delay using 10.2 mCi of Technetium 42m Sestamibi.  FINDINGS: ECG: SR poor R wave progression Nonspecific lateral ST segment changes  Symptoms: None  RAW Data: Motion and breast artifact Resting images also with bowel attenuation  QPS: 1  Quantitative Gated SPECT EF:  70% no RWMAls  Perfusion Images: Images reconstructed in the vertical, horizontal and short axis planes. No ischemia. Resting images with bowel artifact.  Breast attenuation  IMPRESSION: Low risk myovue of poor quality with motion and breast attenuation and bowel artifact on resting images. No ischemia EF 70% with no RWMA's  Jenkins Rouge   Electronically Signed   By: Jenkins Rouge M.D.   On: 12/19/2013 13:57    Discharge Medications     Medication List         diltiazem 240 MG 24 hr capsule  Commonly known as:  CARDIZEM CD  Take 240 mg by mouth daily.     lisinopril 10 MG tablet  Commonly known as:  PRINIVIL,ZESTRIL  Take 10 mg by mouth daily.     MAGNACAPS PO  Take 1 tablet by mouth daily.     omeprazole 20 MG capsule  Commonly known as:  PRILOSEC  Take 20 mg by mouth daily.     VITAMIN D (ERGOCALCIFEROL) PO  Take 1 tablet by mouth daily.        Disposition   The patient will be discharged in stable condition to home.  Follow-up Information   Follow up with Kirk Ruths, MD. (Please make an appointment with Dr. Stanford Breed or a physcian assistant in the next two weeks to follow up on your blood pressure )    Specialty:  Cardiology   Contact information:   [address] [place] [address] [phone]         Duration of Discharge Encounter: Greater than 30 minutes including physician and PA time.  SignedVertell Limber, Murl Golladay PA-C 12/19/2013, 4:50 PM

## 2013-12-19 NOTE — Progress Notes (Addendum)
Patient Name: Laura Morris Date of Encounter: 12/19/2013     Active Problems:   Chest pain    SUBJECTIVE  She had the one episode of severe chest pain last night before arrival to the hospital. She had a little "hiccup" of chest pain last night with only tightness and it lasted a few seconds. She does admit to mild chronic DOE with daily activities. She also has had a cough for a couple of months that is non productive.  CURRENT MEDS . aspirin EC  81 mg Oral Daily  . atorvastatin  20 mg Oral q1800  . diltiazem  240 mg Oral Daily  . enoxaparin (LOVENOX) injection  40 mg Subcutaneous Q24H  . lisinopril  10 mg Oral Daily  . metoprolol tartrate  25 mg Oral BID    OBJECTIVE  Filed Vitals:   12/18/13 2245 12/18/13 2315 12/19/13 0001 12/19/13 0443  BP: 143/83 135/63 128/48 123/49  Pulse: 73 64 66 72  Temp:   98 F (36.7 C) 98.1 F (36.7 C)  TempSrc:    Oral  Resp: 25 15  18   Height:   5\' 6"  (1.676 m)   Weight:   181 lb 4.8 oz (82.237 kg) 181 lb 4.8 oz (82.237 kg)  SpO2: 99% 96% 94% 93%   No intake or output data in the 24 hours ending 12/19/13 0820 Filed Weights   12/18/13 2028 12/19/13 0001 12/19/13 0443  Weight: 185 lb 3.2 oz (84.006 kg) 181 lb 4.8 oz (82.237 kg) 181 lb 4.8 oz (82.237 kg)    PHYSICAL EXAM  General: Pleasant, NAD. Neuro: Alert and oriented X 3. Moves all extremities spontaneously. Psych: Normal affect. HEENT:  Normal  Neck: Supple without bruits or JVD. Lungs:  Resp regular and unlabored, CTA. Heart: RRR no s3, s4, or murmurs. Abdomen: Soft, non-tender, non-distended, BS + x 4.  Extremities: No clubbing, cyanosis or edema. DP/PT/Radials 2+ and equal bilaterally.  Accessory Clinical Findings  CBC  Recent Labs  12/18/13 2021 12/19/13 0525  WBC 8.7 6.5  HGB 14.0 12.6  HCT 40.6 37.3  MCV 90.2 90.8  PLT 239 696   Basic Metabolic Panel  Recent Labs  12/18/13 2021 12/19/13 0525  NA 143 141  K 4.5 3.9  CL 103 104  CO2 23 24    GLUCOSE 91 100*  BUN 14 12  CREATININE 0.75 0.70  CALCIUM 9.2 9.0    Cardiac Enzymes  Recent Labs  12/19/13 0102 12/19/13 0525  TROPONINI <0.30 <0.30    Fasting Lipid Panel  Recent Labs  12/19/13 0525  CHOL 162  HDL 58  LDLCALC 81  TRIG 114  CHOLHDL 2.8   TELE  NSR   Radiology/Studies  Dg Chest 2 View  12/18/2013   CLINICAL DATA:  Chest pain.  History of smoking.  EXAM: CHEST  2 VIEW  COMPARISON:  Chest radiograph performed 10/23/2009  FINDINGS: The lungs are well-aerated. Mild peribronchial thickening is noted. Chronically increased interstitial markings are seen. There is no evidence of pleural effusion or pneumothorax.  The heart is normal in size; the mediastinal contour is within normal limits. No acute osseous abnormalities are seen. Clips are noted within the right upper quadrant, reflecting prior cholecystectomy.  IMPRESSION: Chronic lung changes noted; no acute cardiopulmonary process seen.   Electronically Signed   By: Garald Balding M.D.   On: 12/18/2013 22:36    ASSESSMENT AND PLAN Laura Morris is a 63 y.o. female with a history of HTN, HLD, continued  tobacco abuse (40years), FH of CAD, and SVT who was admitted with chest pain last night. Pt states that she had sudden onset of substernal chest pain " like something hit me and then is sitting on me " with associated SOB , nausea and lightheadedness. Pt resolved spontaneously over the next several minutes prior to her arrival in the ED .   Chest pain- She does have RFs including HTN, HLD, continued tobacco abuse (40years), FH of CAD. Patient apparently had a negative Myoview in March of 2010 in [place] and stress echocardiogram in September of 2011 was normal, but the patient did not achieve target heart rate.  -- Trpn neg x2, POC trp neg. ECG with no acute ST or TW changes -- Lexiscan myovue this morning.   HTN- She was quite hypertensive on admission 188/59. Now well controlled on lisinopril and lopressor  and dilt  Hx of SVT- quiescent on diltiazem  HLD- under good control. Continue statin  Tobacco abuse- counseled on cessation  Tyrell Antonio PA-C  Pager [phone]  Agree with note by Lorretta Harp PA-C. Pt admitted with 1 episode of worrisome CP in pt with multiple + CRF. Enz neg. EKG w/o acute changes. Pain free. Exam benign. Plan lexiscan MPI this am. Pt is NPO.  Lorretta Harp, M.D., [place], [facility], [place] [address] [place], [place]  [address]  [phone] 12/19/2013 9:56 AM   Myoview was neg for ischemia. Will D/C home and F/U as OP.  Lorretta Harp, M.D., [place], [facility], [place] [address] [place],   [address]  [phone] 12/19/2013 3:36 PM

## 2013-12-19 NOTE — Progress Notes (Signed)
UR completed 

## 2013-12-20 ENCOUNTER — Telehealth: Payer: Self-pay | Admitting: Cardiology

## 2013-12-20 NOTE — Telephone Encounter (Signed)
New Message:  Pt was recently seen in the hospital and pt wants to know if she "really needs to follow up with [place]."  Pt would like a call back from the nurse to let her know if she should come here or to her PcP... Pt's discharge summary states, "Follow up with Kirk Ruths, MD. (Please make an appointment with Dr. Stanford Breed or a physcian assistant in the next two weeks to follow up on your blood pressure )."   Pt states her PcP handles her blood pressure meds.Marland KitchenMarland KitchenMarland Kitchen

## 2013-12-20 NOTE — Telephone Encounter (Signed)
Pt was called, she feels she does not need to f/u with cardiology at this time and declines visit. Pt will f/u with pcp.

## 2014-04-27 ENCOUNTER — Ambulatory Visit: Payer: Medicare Other | Admitting: Cardiology

## 2014-09-18 DIAGNOSIS — Z01818 Encounter for other preprocedural examination: Secondary | ICD-10-CM | POA: Diagnosis not present

## 2014-09-18 DIAGNOSIS — Z1321 Encounter for screening for nutritional disorder: Secondary | ICD-10-CM | POA: Diagnosis not present

## 2014-09-18 DIAGNOSIS — K219 Gastro-esophageal reflux disease without esophagitis: Secondary | ICD-10-CM | POA: Diagnosis not present

## 2014-09-18 DIAGNOSIS — I10 Essential (primary) hypertension: Secondary | ICD-10-CM | POA: Diagnosis not present

## 2014-09-27 DIAGNOSIS — M94211 Chondromalacia, right shoulder: Secondary | ICD-10-CM | POA: Diagnosis not present

## 2014-09-27 DIAGNOSIS — E785 Hyperlipidemia, unspecified: Secondary | ICD-10-CM | POA: Diagnosis not present

## 2014-09-27 DIAGNOSIS — Z720 Tobacco use: Secondary | ICD-10-CM | POA: Diagnosis not present

## 2014-09-27 DIAGNOSIS — M19011 Primary osteoarthritis, right shoulder: Secondary | ICD-10-CM | POA: Diagnosis not present

## 2014-09-27 DIAGNOSIS — R918 Other nonspecific abnormal finding of lung field: Secondary | ICD-10-CM | POA: Diagnosis not present

## 2014-09-27 DIAGNOSIS — M7541 Impingement syndrome of right shoulder: Secondary | ICD-10-CM | POA: Diagnosis not present

## 2014-09-27 DIAGNOSIS — K219 Gastro-esophageal reflux disease without esophagitis: Secondary | ICD-10-CM | POA: Diagnosis not present

## 2014-09-27 DIAGNOSIS — G709 Myoneural disorder, unspecified: Secondary | ICD-10-CM | POA: Diagnosis not present

## 2014-09-27 DIAGNOSIS — M75111 Incomplete rotator cuff tear or rupture of right shoulder, not specified as traumatic: Secondary | ICD-10-CM | POA: Diagnosis not present

## 2014-09-27 DIAGNOSIS — M7521 Bicipital tendinitis, right shoulder: Secondary | ICD-10-CM | POA: Diagnosis not present

## 2014-09-27 DIAGNOSIS — I10 Essential (primary) hypertension: Secondary | ICD-10-CM | POA: Diagnosis not present

## 2014-09-27 DIAGNOSIS — I209 Angina pectoris, unspecified: Secondary | ICD-10-CM | POA: Diagnosis not present

## 2014-09-27 DIAGNOSIS — M25511 Pain in right shoulder: Secondary | ICD-10-CM | POA: Diagnosis not present

## 2014-09-27 DIAGNOSIS — G8918 Other acute postprocedural pain: Secondary | ICD-10-CM | POA: Diagnosis not present

## 2014-11-13 DIAGNOSIS — R531 Weakness: Secondary | ICD-10-CM | POA: Diagnosis not present

## 2014-11-13 DIAGNOSIS — Z4789 Encounter for other orthopedic aftercare: Secondary | ICD-10-CM | POA: Diagnosis not present

## 2014-11-13 DIAGNOSIS — R6889 Other general symptoms and signs: Secondary | ICD-10-CM | POA: Diagnosis not present

## 2014-11-14 DIAGNOSIS — R531 Weakness: Secondary | ICD-10-CM | POA: Diagnosis not present

## 2014-11-14 DIAGNOSIS — R6889 Other general symptoms and signs: Secondary | ICD-10-CM | POA: Diagnosis not present

## 2014-11-14 DIAGNOSIS — Z4789 Encounter for other orthopedic aftercare: Secondary | ICD-10-CM | POA: Diagnosis not present

## 2014-11-21 DIAGNOSIS — Z4789 Encounter for other orthopedic aftercare: Secondary | ICD-10-CM | POA: Diagnosis not present

## 2014-11-21 DIAGNOSIS — R6889 Other general symptoms and signs: Secondary | ICD-10-CM | POA: Diagnosis not present

## 2014-11-21 DIAGNOSIS — R531 Weakness: Secondary | ICD-10-CM | POA: Diagnosis not present

## 2014-11-23 DIAGNOSIS — D3130 Benign neoplasm of unspecified choroid: Secondary | ICD-10-CM | POA: Diagnosis not present

## 2015-02-12 DIAGNOSIS — I10 Essential (primary) hypertension: Secondary | ICD-10-CM | POA: Diagnosis not present

## 2015-02-12 DIAGNOSIS — E2839 Other primary ovarian failure: Secondary | ICD-10-CM | POA: Diagnosis not present

## 2015-02-12 DIAGNOSIS — J309 Allergic rhinitis, unspecified: Secondary | ICD-10-CM | POA: Diagnosis not present

## 2015-02-12 DIAGNOSIS — K219 Gastro-esophageal reflux disease without esophagitis: Secondary | ICD-10-CM | POA: Diagnosis not present

## 2015-02-12 DIAGNOSIS — R252 Cramp and spasm: Secondary | ICD-10-CM | POA: Diagnosis not present

## 2015-02-12 DIAGNOSIS — K580 Irritable bowel syndrome with diarrhea: Secondary | ICD-10-CM | POA: Diagnosis not present

## 2015-02-12 DIAGNOSIS — I471 Supraventricular tachycardia: Secondary | ICD-10-CM | POA: Diagnosis not present

## 2015-02-21 DIAGNOSIS — E2839 Other primary ovarian failure: Secondary | ICD-10-CM | POA: Diagnosis not present

## 2015-02-26 DIAGNOSIS — Z1231 Encounter for screening mammogram for malignant neoplasm of breast: Secondary | ICD-10-CM | POA: Diagnosis not present

## 2015-03-05 DIAGNOSIS — R1030 Lower abdominal pain, unspecified: Secondary | ICD-10-CM | POA: Diagnosis not present

## 2015-03-25 DIAGNOSIS — K219 Gastro-esophageal reflux disease without esophagitis: Secondary | ICD-10-CM | POA: Diagnosis not present

## 2015-03-25 DIAGNOSIS — R197 Diarrhea, unspecified: Secondary | ICD-10-CM | POA: Diagnosis not present

## 2015-03-25 DIAGNOSIS — D126 Benign neoplasm of colon, unspecified: Secondary | ICD-10-CM | POA: Diagnosis not present

## 2015-03-25 DIAGNOSIS — R1084 Generalized abdominal pain: Secondary | ICD-10-CM | POA: Diagnosis not present

## 2015-03-25 DIAGNOSIS — I10 Essential (primary) hypertension: Secondary | ICD-10-CM | POA: Diagnosis not present

## 2015-03-25 DIAGNOSIS — R110 Nausea: Secondary | ICD-10-CM | POA: Diagnosis not present

## 2015-04-08 DIAGNOSIS — R1084 Generalized abdominal pain: Secondary | ICD-10-CM | POA: Diagnosis not present

## 2015-04-08 DIAGNOSIS — R197 Diarrhea, unspecified: Secondary | ICD-10-CM | POA: Diagnosis not present

## 2015-04-08 DIAGNOSIS — R110 Nausea: Secondary | ICD-10-CM | POA: Diagnosis not present

## 2015-04-08 DIAGNOSIS — R109 Unspecified abdominal pain: Secondary | ICD-10-CM | POA: Diagnosis not present

## 2015-04-22 DIAGNOSIS — R1084 Generalized abdominal pain: Secondary | ICD-10-CM | POA: Diagnosis not present

## 2015-04-22 DIAGNOSIS — K5900 Constipation, unspecified: Secondary | ICD-10-CM | POA: Diagnosis not present

## 2015-04-22 DIAGNOSIS — K219 Gastro-esophageal reflux disease without esophagitis: Secondary | ICD-10-CM | POA: Diagnosis not present

## 2015-04-30 DIAGNOSIS — K219 Gastro-esophageal reflux disease without esophagitis: Secondary | ICD-10-CM | POA: Diagnosis not present

## 2015-04-30 DIAGNOSIS — I10 Essential (primary) hypertension: Secondary | ICD-10-CM | POA: Diagnosis not present

## 2015-04-30 DIAGNOSIS — I471 Supraventricular tachycardia: Secondary | ICD-10-CM | POA: Diagnosis not present

## 2015-04-30 DIAGNOSIS — J309 Allergic rhinitis, unspecified: Secondary | ICD-10-CM | POA: Diagnosis not present

## 2015-04-30 DIAGNOSIS — Z0000 Encounter for general adult medical examination without abnormal findings: Secondary | ICD-10-CM | POA: Diagnosis not present

## 2015-05-19 DIAGNOSIS — Z5321 Procedure and treatment not carried out due to patient leaving prior to being seen by health care provider: Secondary | ICD-10-CM | POA: Diagnosis not present

## 2015-05-19 DIAGNOSIS — R079 Chest pain, unspecified: Secondary | ICD-10-CM | POA: Diagnosis not present

## 2015-06-10 DIAGNOSIS — Z86010 Personal history of colonic polyps: Secondary | ICD-10-CM | POA: Diagnosis not present

## 2015-06-10 DIAGNOSIS — D123 Benign neoplasm of transverse colon: Secondary | ICD-10-CM | POA: Diagnosis not present

## 2015-06-10 DIAGNOSIS — K2950 Unspecified chronic gastritis without bleeding: Secondary | ICD-10-CM | POA: Diagnosis not present

## 2015-06-10 DIAGNOSIS — K219 Gastro-esophageal reflux disease without esophagitis: Secondary | ICD-10-CM | POA: Diagnosis not present

## 2015-06-10 DIAGNOSIS — K635 Polyp of colon: Secondary | ICD-10-CM | POA: Diagnosis not present

## 2015-07-08 DIAGNOSIS — Z23 Encounter for immunization: Secondary | ICD-10-CM | POA: Diagnosis not present
